# Patient Record
Sex: Female | Born: 1990 | Race: Black or African American | Hispanic: No | Marital: Single | Smoking: Never smoker
Health system: Southern US, Community
[De-identification: ages and names within clinical notes are randomized; demographics above are authoritative.]

---

## 2011-12-06 ENCOUNTER — Emergency Department (INDEPENDENT_AMBULATORY_CARE_PROVIDER_SITE_OTHER)
Admission: EM | Admit: 2011-12-06 | Discharge: 2011-12-06 | Disposition: A | Discharge status: Home / Self Care | Payer: Managed Care, Other (non HMO) | Source: Home / Self Care | Attending: Family Medicine | Admitting: Family Medicine

## 2011-12-06 ENCOUNTER — Emergency Department (INDEPENDENT_AMBULATORY_CARE_PROVIDER_SITE_OTHER): Payer: Managed Care, Other (non HMO)

## 2011-12-06 ENCOUNTER — Encounter (HOSPITAL_COMMUNITY): Payer: Self-pay

## 2011-12-06 DIAGNOSIS — S93609A Unspecified sprain of unspecified foot, initial encounter: Secondary | ICD-10-CM

## 2011-12-06 DIAGNOSIS — S93601A Unspecified sprain of right foot, initial encounter: Secondary | ICD-10-CM

## 2011-12-06 MED ORDER — IBUPROFEN 800 MG PO TABS: 800.0000 mg | ORAL_TABLET | Freq: Three times a day (TID) | ORAL | Status: DC
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
## 2011-12-06 NOTE — ED Provider Notes (Signed)
History     CSN: [id number]  Arrival date & time 12/06/11  1308   First MD Initiated Contact with Patient 12/06/11 1958      Chief Complaint  Patient presents with  . Foot Injury    (Consider location/radiation/quality/duration/timing/severity/associated sxs/prior treatment) Patient is a 21 y.o. female presenting with foot injury. The history is provided by the patient.  Foot Injury  The incident occurred more than 2 days ago. The incident occurred at home. The injury mechanism was a fall (fell down stairs, twisted foot. ). The pain is present in the right foot. The quality of the pain is described as sharp. The pain is mild. The pain has been constant since onset. Associated symptoms include inability to bear weight. She reports no foreign bodies present.    History reviewed. No pertinent past medical history.  History reviewed. No pertinent past surgical history.  History reviewed. No pertinent family history.  History  Substance Use Topics  . Smoking status: Never Smoker   . Smokeless tobacco: Not on file  . Alcohol Use: No    OB History    Grav Para Term Preterm Abortions TAB SAB Ect Mult Living                  Review of Systems  Constitutional: Negative.   Musculoskeletal: Positive for gait problem.  Skin: Negative.   Neurological: Negative.     Allergies  Review of patient's allergies indicates no known allergies.  Home Medications   Current Outpatient Rx  Name Route Sig Dispense Refill  . IBUPROFEN 800 MG PO TABS Oral Take 1 tablet (800 mg total) by mouth 3 (three) times daily. 30 tablet 0    BP 107/75  Pulse 70  Temp 99 F (37.2 C) (Oral)  SpO2 98%  LMP 11/27/2011  Physical Exam  Nursing note and vitals reviewed. Constitutional: She is oriented to person, place, and time. She appears well-developed and well-nourished.  Musculoskeletal: She exhibits tenderness.       Feet:  Neurological: She is alert and oriented to person, place, and  time.  Skin: Skin is warm and dry.    ED Course  Procedures (including critical care time)  Labs Reviewed - No data to display No results found.   1. Sprain of right foot       MDM  X-rays reviewed and report per radiologist.         Linna Hoff, MD 12/06/11 2050

## 2015-03-24 ENCOUNTER — Emergency Department (HOSPITAL_COMMUNITY): Payer: Managed Care, Other (non HMO)

## 2015-03-24 ENCOUNTER — Emergency Department (HOSPITAL_COMMUNITY)
Admission: EM | Admit: 2015-03-24 | Discharge: 2015-03-24 | Disposition: A | Discharge status: Home / Self Care | Payer: Managed Care, Other (non HMO) | Attending: Emergency Medicine | Admitting: Emergency Medicine

## 2015-03-24 ENCOUNTER — Emergency Department (HOSPITAL_COMMUNITY): Payer: Self-pay

## 2015-03-24 ENCOUNTER — Encounter (HOSPITAL_COMMUNITY): Payer: Self-pay | Admitting: Emergency Medicine

## 2015-03-24 DIAGNOSIS — S199XXA Unspecified injury of neck, initial encounter: Secondary | ICD-10-CM | POA: Insufficient documentation

## 2015-03-24 DIAGNOSIS — Y92410 Unspecified street and highway as the place of occurrence of the external cause: Secondary | ICD-10-CM | POA: Insufficient documentation

## 2015-03-24 DIAGNOSIS — Y9389 Activity, other specified: Secondary | ICD-10-CM | POA: Insufficient documentation

## 2015-03-24 DIAGNOSIS — Y998 Other external cause status: Secondary | ICD-10-CM | POA: Insufficient documentation

## 2015-03-24 DIAGNOSIS — J029 Acute pharyngitis, unspecified: Secondary | ICD-10-CM | POA: Insufficient documentation

## 2015-03-24 DIAGNOSIS — S022XXB Fracture of nasal bones, initial encounter for open fracture: Secondary | ICD-10-CM | POA: Insufficient documentation

## 2015-03-24 MED ORDER — TETANUS-DIPHTH-ACELL PERTUSSIS 5-2.5-18.5 LF-MCG/0.5 IM SUSP
0.5000 mL | Freq: Once | INTRAMUSCULAR | Status: DC
  Filled 2015-03-24: qty 0.5

## 2015-03-24 MED ORDER — MORPHINE SULFATE (PF) 4 MG/ML IV SOLN
4.0000 mg | Freq: Once | INTRAVENOUS | Status: DC
  Filled 2015-03-24: qty 1

## 2015-03-24 MED ORDER — ONDANSETRON HCL 4 MG/2ML IJ SOLN
4.0000 mg | Freq: Once | INTRAMUSCULAR | Status: DC
  Filled 2015-03-24: qty 2

## 2015-03-24 MED ORDER — CEPHALEXIN 500 MG PO CAPS: 500.0000 mg | ORAL_CAPSULE | Freq: Four times a day (QID) | ORAL | Status: AC

## 2015-03-24 MED ORDER — LIDOCAINE HCL (PF) 1 % IJ SOLN
INTRAMUSCULAR | Status: AC
  Filled 2015-03-24: qty 5

## 2015-03-24 MED ORDER — HYDROCODONE-ACETAMINOPHEN 5-325 MG PO TABS: 1.0000 | ORAL_TABLET | Freq: Four times a day (QID) | ORAL | Status: AC | PRN
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
## 2015-03-24 NOTE — ED Notes (Signed)
Per other caregivers, patient has refused all attempts to care for her. Including: lab work, imaging, help to restroom, and medications. At this time patient just wants her face fixed and to be allowed to go home. MD Horton aware.

## 2015-03-24 NOTE — ED Notes (Signed)
Pt refused to get into a gown.
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
## 2015-03-24 NOTE — ED Notes (Addendum)
Pt refused blood work  

## 2015-03-24 NOTE — ED Notes (Signed)
Tried to assist pt to the bathroom, pt refused.
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
## 2015-03-24 NOTE — ED Notes (Signed)
MD Horton at bedside to suture patient's facial.
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
## 2018-09-06 ENCOUNTER — Other Ambulatory Visit: Payer: Self-pay | Admitting: Internal Medicine

## 2018-09-06 DIAGNOSIS — Z20822 Contact with and (suspected) exposure to covid-19: Secondary | ICD-10-CM

## 2018-09-09 LAB — NOVEL CORONAVIRUS, NAA: SARS-CoV-2, NAA: NOT DETECTED
